# Patient Record
Sex: Male | Born: 2000 | Race: Asian | Hispanic: No | Marital: Single | Smoking: Never smoker
Health system: Western US, Academic
[De-identification: ages and names within clinical notes are randomized; demographics above are authoritative.]

---
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
## 2021-02-16 ENCOUNTER — Other Ambulatory Visit: Payer: Self-pay | Admitting: Family Medicine

## 2021-02-16 LAB — COVID-19 CORONAVIRUS DETECTION ASSAY ASYMPTOMATIC: COVID-19 Coronavirus by PCR – External Lab: NOT DETECTED

## 2022-01-02 ENCOUNTER — Ambulatory Visit (INDEPENDENT_AMBULATORY_CARE_PROVIDER_SITE_OTHER): Admitting: Public Health & General Preventive Medicine

## 2022-01-02 ENCOUNTER — Encounter (INDEPENDENT_AMBULATORY_CARE_PROVIDER_SITE_OTHER): Payer: Self-pay | Admitting: Public Health & General Preventive Medicine

## 2022-01-02 VITALS — BP 122/67 | HR 85 | Temp 96.9°F | Ht 66.69 in | Wt 133.0 lb

## 2022-01-02 NOTE — Progress Notes (Addendum)
Chief Complaint   Patient presents with    Other     Check up and labs and flu shot       Race, Ethnicity, Gender Identity and Sexuality (Go To REAL ME/SOGI section   above to add or update information)    Preferred Name: Richard Aguirre Race: Asian   Hispanic/Latin Ethnicity: Not Hispanic, Latino(a), or Spanish origin   Ethnicity: [place]   Patient's gender identity: Male    Patient's sexual orientation: Straight    Patient's sex assigned at birth: Male         Pt was instructed to update their problem list, allergies, medical history, medications, social history and family history prior to visit or at their kiosk check-in, prior to every visit.    Pt's chart was reviewed prior to the visit in my usual manner.     Subjective:   Richard Aguirre is a(n) 21 year old person who presents to [facility] for well check  Doing well  2 weeks ago few red bumps on arms and legs, has used a hot tub.  Resolved  Skin can be dry on his arms. Ok today.    Junior in [place]  See last visit  Forgot to do screening labs  Wants flu vaccine    Little exercise, too busy           Denies fever, chills, night sweats, sore throat, headache, visual problems, cough, chest pain, pleuritic chest pain, shortness of breath, dyspnea on exertion, wheeze, palpitations, syncope, abdominal pain, nausea, emesis, diarrhea, brbpr, melena, pelvic pain, back pain, GU symptoms, photophobia, meningismus, leg swelling or pain, rash.    No known history of blood clots, pulmonary embolus, pneumothorax, myocarditis    Denies mental health concerns today.      Review of Systems:    Patient denies other symptoms or concerns today.      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     No current facility-administered  medications for this visit.     Allergies   Allergen Reactions    Mushroom Extract Complex Rash     And Throat Swelling   Occurs specifically with Shitake       Immunization History   Administered Date(s) Administered    (Chicken Pox) Varicella Vaccine 05/26/2002, 05/22/2005    COVID-19 (Moderna) Low Dose Red Cap >= 18 Years 02/05/2020    COVID-19 (Moderna) Red Cap >= 12 Years 06/12/2019, 07/10/2019    DTaP 05/26/2002, 06/14/2006    DTaP-HepB-IPV ([place]) 07/15/2001, 09/24/2001, 11/18/2001    H1N1 Vaccine 05/11/2008    HIB (PedvaxHIB) Normally 3 Dose Sch 07/15/2001, 09/24/2001    HPV Unspecified Formulation 07/15/2013, 09/15/2013, 01/14/2014    HPV-4 Vaccine (GARDASIL-4) 07/15/2013, 09/15/2013    HPV-9 Vaccine (GARDASIL-9) 01/14/2014    Hep-A Ped/Adol 2 Dose Schedule 05/18/2003, 05/16/2004    Hepatitis B Vaccine Adjuvanted (Adult 18 Yrs and Older) 09/24/2001, 11/18/2001, 05/26/2002    Hepatitis B vaccine (adult 20 yrs and older) 2001-08-08    Hib-HepB (COMVAX) 05/26/2002    Inactivated Polio Vaccine (IPV) 07/15/2001, 09/24/2001, 11/18/2001, 05/22/2005    Influenza Vaccine (Unspecified) 12/29/2009, 11/04/2012, 11/19/2013    Influenza Vaccine >=6 Months 11/15/2011, 11/04/2012,  11/19/2013, 10/28/2014, 12/29/2015, 11/01/2016, 10/25/2017, 11/07/2018, 11/12/2019, 01/02/2022    Influenza Vaccine, Nasal Quadrivalent 02/15/2011    MMR 05/26/2002, 05/22/2005    MMR-Varicella (ProQuad) 05/22/2005    Meningococcal Vaccine 07/24/2012, 07/16/2017    Pneumococcal 7 Vaccine (PREVNAR-7) 07/15/2001, 09/24/2001, 11/18/2001, 12/01/2002    Tdap 07/10/2011    Typhoid IM vaccine (Vi) 08/13/2008     Patient Active Problem List    Diagnosis Date Noted    Well adult health check 01/02/2022    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  No family history on file.      Objective:  BP 122/67   Pulse 85   Temp 96.9 F (36.1 C) (Oral)   Ht 5' 6.69" (1.694 m)   Wt 60.3 kg (133  lb)   BMI 21.02 kg/m   No LMP for male patient.  Body mass index is 21.02 kg/m.      Wt Readings from Last 5 Encounters:   01/02/22 60.3 kg (133 lb)   05/28/20 58.4 kg (128 lb 12.8 oz) (12 %, Z= -1.15)*   11/12/19 57.8 kg (127 lb 6.4 oz) (13 %, Z= -1.13)*     * Growth percentiles are based on CDC (Boys, 2-20 Years) data.     Blood Pressure   01/02/22 122/67   05/28/20 132/84   11/12/19 127/79           Patient declines chaperone.    Constitutional:       General: Not in acute distress. Pleasant.     Appearance: Not ill-appearing, toxic-appearing or diaphoretic.   HENT:      Mouth: Mucous membranes are moist.      Pharynx: Oropharynx is clear. No oropharyngeal exudate or erythema.     No trismus. Uvula is midline.  Lymphadenopathy:      No cervical or supraclavicular adenopathy.   Eyes:      General: No scleral icterus. No photophobia.     Conjunctiva/sclera: Conjunctivae normal.      Pupils: Pupils are equal, round, and reactive to light.  Neck:      Normal range of motion and neck supple. No neck rigidity or muscular tenderness.       Normal thyroid exam.      Trachea is midline. No crepitus.  Cardiovascular:      Rate and Rhythm: Normal rate and regular rhythm.      Pulses: Normal pulses. No splinter hemorrhages.     Heart sounds: Normal heart sounds.   Pulmonary:      Effort: Pulmonary effort is normal. Excellent air movement.     Breath sounds: Normal breath sounds. No wheezing, rales or rhonchi.   Abdominal:     There is no distension, tenderness, or guarding. No hepatosplenomegaly.  Musculoskeletal: Normal range of motion. No edema.  Back: No CVAT. No spinal TTP. No deformity.  GU/Rectal: patient declined exam  Skin:     General: Skin is warm.      Capillary Refill: Capillary refill takes less than 1 second.     Coloration: Skin is not jaundiced.      Findings: No erythema or rash. No concerning lesions.  Neurological:      General: No focal deficit present.      Mental Status: alert and oriented to person,  place, and time.      Gait normal.   Psychiatric:         Normal affect. Normal  thought process and speech.      Assessment and Plan:  Isao was seen today for other.    Diagnoses and all orders for this visit:    Need for vaccination  -     Fluarix/FluZone Quadrivalent (>=6 Months) - Prefilled Syringe    Well adult health check  -     CBC - [facility]; Future  -     CHEM Panel - [facility]; Future  -     Glycosylated Hgb(A1C), Blood; Future  -     Urinalysis - [facility]; Future  -     TSH with Reflex to FT4 - [facility]; Future  -     Lipid Panel - [facility]; Future         Education  Counseling  Risks, alternatives, benefits, options and side-effects discussed with patient  Discussed insurance coverage    ** Please see Patient Instructions for plan as well    Return or call for any problems, new or worse symptoms: phone [phone]    Call 911 or go to the ER if an emergency    Go to the [facility] on [address] in [facility] or [place] or ANY local urgent care/ER for new/worsening problems or if having problems after-hours      ** please excuse typing errors

## 2022-01-02 NOTE — Interdisciplinary (Signed)
The patient has completed the online vaccine questionnaire, has been provided with the appropriate Vaccine Information Statement (VIS) and indicates no contraindications to receiving the ordered vaccine(s).  I have verified the correct vaccine(s) and dose(s) with the ordering Provider.   The patient was provided with warning signs to watch for in the event of an allergic response and possible side effects and I have advised the patient to wait 15-20 mins in the clinic following administration of the vaccine(s).  After this time, if they are feeing well, they may leave the clinic and can contact a health care provider should they need any further medical attention.

## 2022-01-02 NOTE — Patient Instructions (Addendum)
According to the records in your [facility] chart, you are overdue for the Tdap (tetanus) booster vaccine given every 10 years  Please make a Nurse Clinic Appt on MyChart OR update in MyChart if you already had the vaccine in the last 10 years    Fasting labs    Exercise most days of the week      Allergy trigger avoidance:    Take a brief cool shower only once a day  Dove soap bar  Keep your windows closed during the day  No vacuuming  Once a day anti-histamine tablet for sneezing, itchy eyes or itchy skin: Cetirizine (Zyrtec): you don't need a prescription for this and can get at [facility] or [facility]  Moisturize your skin twice a day with Cerave, Eucerin, Aveeno or Lubriderm lotion            Return or call for problems    Go the Emergency Room for worsening problems        Please get a Flu (influenza) vaccine every fall quarter.  You can schedule in MyChart under Nurse Clinic or get at any [facility] (show your insurance card)    You can get the COVID vaccine at any [facility] or make a Nurse Clinic Appt in MyChart    ** Secure messages in MyChart should NOT be used for urgent medical questions or advice. You may not get a response for a few business days.    For Mental Health concerns or appointments, please call [facility] at [phone]    Need an appointment at [facility]?  Book on MyChart or please call Group 1 at [phone] if you need help    Medical questions or concerns? Call [phone] to speak to a Nurse including after-hours when the building is closed.    Insurance questions?  Call [phone]      AFTER HOURS CARE    Go to the [facility] on [facility], the [place] in [facility] or [place] or ANY local urgent care/Emergency Room for new or worsening problems or if having problems when we are closed. You do not need a referral from [facility].    [facility]  Open 7 days a week, 8 am to 8 pm  [address]  [place], [place] [address]  [phone]     If urgent care is closed,  please go to the [place] [facility] or the closest Emergency Room.        PICK A PRIMARY CARE PROVIDER if you don't have one or want to change (pick an NP or a Physician)    Pick a PCP and follow-up as needed  The [facility] website has a list of providers to choose from  [email or web address]      PREVENTION:    Skin cancer prevention: sunscreen (titanium oxide or zinc oxide best), wear a hat, avoid the midday sun    See an eye doctor at [facility] clinic every 1-2 years [phone])    See a dentist twice a year. If you have SHIP:   [email or web address]

## 2022-01-05 ENCOUNTER — Other Ambulatory Visit (INDEPENDENT_AMBULATORY_CARE_PROVIDER_SITE_OTHER)

## 2022-01-05 ENCOUNTER — Encounter (INDEPENDENT_AMBULATORY_CARE_PROVIDER_SITE_OTHER): Payer: Self-pay | Admitting: Public Health & General Preventive Medicine

## 2022-01-05 LAB — CBC FOR PE (WITH/WITHOUT DIFF)
Baso#: 0.03 10*3/uL (ref 0.00–0.20)
Baso%: 0.3 % (ref 0.0–2.0)
Eos#: 0.17 10*3/uL (ref 0.00–0.40)
Eos%: 1.5 % (ref 0.0–5.0)
Hct: 43.3 % (ref 40.0–52.0)
Hgb: 14.5 g/dL (ref 13.6–17.2)
IMMUNOGRANULOCYTES#s: 0.01 10*3/uL (ref 0.00–0.10)
IMMUNOGRANULOCYTES%s: 0.1 % (ref 0.0–1.0)
Lym#: 2 10*3/uL (ref 1.00–5.00)
Lym%: 17.3 % — ABNORMAL LOW (ref 25.0–50.0)
MCH: 28 pg (ref 27.0–33.0)
MCHC: 33.5 g/dL (ref 32.0–36.0)
MCV: 84 um3 (ref 80–97)
MPV: 9 fL (ref 7.0–11.0)
Mon#: 0.98 10*3/uL (ref 0.00–1.00)
Mon%: 8.5 % (ref 2.0–10.0)
Neu#: 8.4 10*3/uL — ABNORMAL HIGH (ref 2.00–8.00)
Neu%: 72.3 % (ref 50.0–80.0)
PLT: 245 10*3/uL (ref 150–450)
RBC: 5.17 M/uL (ref 4.30–5.90)
RDW-CV: 12.1 % (ref 11.0–15.0)
RDW-SD: 37.7 % (ref 35.1–43.9)
WBC: 11.6 10*3/uL — ABNORMAL HIGH (ref 3.9–9.9)

## 2022-01-05 LAB — CHEM PANEL - SHS
ALT: 15 U/L (ref 5–30)
AST: 15 U/L (ref 7–31)
Albumin: 5.2 g/dL (ref 3.5–5.2)
Alkaline Phosphatase: 59 U/L (ref 44–147)
Bilirubin, Total: 0.9 mg/dL (ref 0.3–1.2)
Calcium: 9.8 mg/dL (ref 8.5–10.2)
Carbon Dioxide: 27 mmol/L (ref 23–29)
Chloride: 98 mmol/L (ref 98–107)
Creatinine: 1.14 mg/dL (ref 0.90–1.30)
GLOBULIN: 3 mg/dL
Glucose: 84 mg/dL (ref 70–105)
Potassium: 3.9 mmol/L (ref 3.5–5.1)
Protein, Total: 7.8 g/dL (ref 6.4–8.3)
Sodium: 138 mmol/L (ref 136–145)
Urea Nitrogen (BUN): 18 mg/dL (ref 6–20)

## 2022-01-05 LAB — URINALYSIS - SHS
Urine Bilirubin: NEGATIVE mg/dl
Urine Blood: NEGATIVE /uL
Urine Ketones: 50 mg/dl — AB
Urine Leukocytes: NEGATIVE /uL
Urine Nitrite: NEGATIVE
Urine Protein: 15 mg/dL — AB
Urine Specific Gravity: 1.02 (ref 1.005–1.035)
Urine pH: 5 (ref 5–9)

## 2022-01-05 LAB — TSH WITH REFLEX TO FT4 - SHS: TSH: 1.11 ï¿½IU/mL (ref 0.50–5.80)

## 2022-01-05 LAB — LIPID PANEL - SHS
CHOL/HDL RATIO: 3 mg/dL (ref <5)
Cholesterol, Total: 209 mg/dL — ABNORMAL HIGH (ref <200)
HDL Cholesterol: 63 mg/dL
LDL Cholesterol: 134 mg/dL — ABNORMAL HIGH (ref <100)
NON-HDLC RATIO: 146 mg/dL — ABNORMAL HIGH (ref <130)
Triglycerides: 58 mg/dL (ref <150)

## 2022-01-05 LAB — URINE MICROSCOPIC - SHS

## 2022-01-06 LAB — GLYCOSYLATED HGB(A1C), BLOOD: Hgb A1C: 5.3 % of total Hgb (ref <5.7)

## 2022-12-06 ENCOUNTER — Emergency Department (INDEPENDENT_AMBULATORY_CARE_PROVIDER_SITE_OTHER): Payer: No Typology Code available for payment source | Admitting: Emergency Medicine

## 2022-12-06 VITALS — BP 126/84 | HR 122 | Temp 100.0°F | Resp 16

## 2022-12-06 DIAGNOSIS — R509 Fever, unspecified: Secondary | ICD-10-CM

## 2022-12-06 DIAGNOSIS — J029 Acute pharyngitis, unspecified: Secondary | ICD-10-CM

## 2022-12-06 LAB — RAPID INFLUENZA A&B NAAT (POCT)
Influenza A, Rapid: NOT DETECTED
Influenza B, Rapid: NOT DETECTED

## 2022-12-06 LAB — COVID-19 RAPID NAAT (POCT): COVID-19 Rapid Assay (POCT): NOT DETECTED

## 2022-12-06 NOTE — Patient Instructions (Signed)
Take advil and drink fluids for your symptoms.  Report to the ED for worsening symptoms.

## 2022-12-06 NOTE — Progress Notes (Signed)
Urgent Care Services    The Date of Service for the Urgent Care encounter is No admission date for patient encounter.   Chief Complaint     Chief Complaint   Patient presents with    Cough    Fever- 9 Weeks To 74 Years     Since Monday.   Took Tylenol at 0400, Dayquil 1 pm    Sore Throat - Simple       Case summary is as follows: The Richard Aguirre is a 22 year old male presents with cough, fever, and sore throat.  Pt reports symptoms started since Monday.  Pt took tylenol and dayquil.  Pt denies trouble swallowing.  No chest pain, or shortness of breath.  He denies cough with sputum production.      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  No past surgical history on file.  Social History     Socioeconomic History    Marital status: Single     Spouse name: Not on file    Number of children: Not on file    Years of education: Not on file    Highest education level: Not on file   Occupational History    Not on file   Tobacco Use    Smoking status: Never    Smokeless tobacco: Never   Substance and Sexual Activity    Alcohol use: Never    Drug use: Never    Sexual activity: Never   Other Topics Concern    Not on file   Social History Narrative    Not on file     Social Determinants of Health     Financial Resource Strain: Not on file   Food Insecurity: Not on file   Transportation Needs: Not on file   Physical Activity: Not on file   Stress: Not on file   Social Connections: Not on file   Intimate Partner Violence: Not on file   Housing Stability: Not on file     No family history on file.    Review of Systems   Constitutional: negative, fatigue, malaise, positive fever.  Eyes: negative, eye pain.  Ears, Nose, Mouth, Throat: positive sore throat, negative nasal congestion.  CV: negative, chest pain.  Resp: positive cough, negative shortness of breath.  GI: negative, vomiting, nausea,  abdominal pain.  GU: negative, dysuria, frequency, hematuria.  Musculoskeletal: negative, back pain.  Integumentary: negative, rash.  Neuro: negative, numbness or tingling.    Physical Examination      12/06/22  1823   BP: 126/84   Pulse: (!) 122   Temp: 100 F (37.8 C)   Resp: 16   SpO2: 98%     Vital Signs noted from Triage Page.  Richard Aguirre is a well developed well nourished male in no apparent distress. he is alert and oriented *3.   HEENT exam shows: Ears TM clear bilaterally. Eyes PERRL EOMI. Mouth MMM. Pharynx injected.   Neck exam is non-tender. No lymphadenopathy. No Thyroid.   Chest exam is clear to auscultation bilaterally.   Cardiac exam shows regular rate and rhythm. No murmurs auscultated.   Abdominal exam has bowel sounds present and soft. The abdomen is non-tender. No masses or rebound can be palpated.  Back exam has no costovertebral angle tenderness. No spinal tenderness to palpation.   Extremity exam is significant  for 2/2 pulses.  Skin exam shows no lesions or rashes.    Medical Decision Plan:   3 days fever. Joint pain.  Chronic headaches.  Chills.  Pt tool 6 tabs of tylenol.  Last 3 days fever doesn't break.  No cigarette use.  No iv drug use.  No chest pain.  Body ache.   History of asthma.   On exam, pt well appearing.  Pharynx injected without tonsillar exudate or enlargement.  No strider. Lungs CTAB. CV shows RRR without murmurs or gallops.    Rapid covid and rapid influenza and rapid strep obtained and it was negative.     Differentials:   Viral infection.       Lab Results     Results for orders placed or performed in visit on 12/06/22   Covid-19 Rapid NAAT (POCT)   Result Value Ref Range    COVID-19 Rapid Assay (POCT) Not Detected Not Detected   Rapid Influenza A&B NAAT (POCT)   Result Value Ref Range    Influenza A, Rapid Not Detected Not Detected    Influenza B, Rapid Not Detected Not Detected       Point of Care Results:      Radiology Results   No results found.    EKG   EKG  Interpretation:     Diagnosis       ICD-10-CM ICD-9-CM    1. Sore throat  J02.9 462 Strep Test (POCT)      2. Fever, unspecified fever cause  R50.9 780.60 CANCELED: Upper Respiratory Pathogen Nucleic Acid Detection Test Viral Transport Media          Medications Prescribed (FOR Discharged Urgent Care Patients

## 2022-12-08 ENCOUNTER — Telehealth (INDEPENDENT_AMBULATORY_CARE_PROVIDER_SITE_OTHER): Payer: Self-pay | Admitting: Emergency Medicine

## 2022-12-08 NOTE — Telephone Encounter (Signed)
Pt aware of negative results and verbalized understanding.

## 2022-12-08 NOTE — Telephone Encounter (Signed)
GENERAL INQUIRY:    Caller Information:  Who is calling? Patient  Best way to contact: Phone: [phone]  What is the reason for the call? Patient calling to clarification on Strep Test results. Patient states he received 2 out of 3 labs and requesting a call back in regards to his strep test.   Encounter Details:  Is this a duplicate encounter?: No previous documentation found on this issue.  Action required by the office: Please contact caller  Patient Information:  Is MyChart active?: Yes.  Verification:  Has the inquiry been read verbatim to the caller and verbalizes satisfaction and confirms the above is accurate?: Yes  Caller has been advised this message will be transmitted to the office and can expect a response within the next 24-72 hours during working business days.  Encounter created by Madaline Guthrie from the Care Assist Team.  Signature generated from controlled access password on December 08, 2022 at 1:51 PM.

## 2022-12-11 ENCOUNTER — Emergency Department (HOSPITAL_COMMUNITY): Payer: No Typology Code available for payment source

## 2022-12-11 ENCOUNTER — Emergency Department
Admission: EM | Admit: 2022-12-11 | Discharge: 2022-12-11 | Disposition: A | Discharge status: Home / Self Care | Payer: No Typology Code available for payment source | Attending: Student in an Organized Health Care Education/Training Program | Admitting: Student in an Organized Health Care Education/Training Program

## 2022-12-11 DIAGNOSIS — R509 Fever, unspecified: Secondary | ICD-10-CM

## 2022-12-11 DIAGNOSIS — R918 Other nonspecific abnormal finding of lung field: Secondary | ICD-10-CM

## 2022-12-11 DIAGNOSIS — R059 Cough, unspecified: Secondary | ICD-10-CM

## 2022-12-11 DIAGNOSIS — J189 Pneumonia, unspecified organism: Secondary | ICD-10-CM | POA: Insufficient documentation

## 2022-12-11 LAB — UPPER RESPIRATORY PATHOGEN NUCLEIC ACID DETECTION TEST
Adenovirus PCR, Nasopharyngeal: NOT DETECTED
Bordetella Parapertussis PCR, Nasopharyngeal: NOT DETECTED
Bordetella Pertussis PCR, Nasopharyngeal: NOT DETECTED
Chlamydia pneumoniae PCR, Nasopharyngeal: NOT DETECTED
Coronavirus 229E PCR, Nasopharyngeal: NOT DETECTED
Coronavirus HKU1 PCR, Nasopharyngeal: NOT DETECTED
Coronavirus NL63 PCR, Nasopharyngeal: NOT DETECTED
Coronavirus OC43 PCR, Nasopharyngeal: NOT DETECTED
Influenza A PCR, Nasopharyngeal: NOT DETECTED
Influenza B PCR, Nasopharyngeal: NOT DETECTED
Metapneumovirus PCR, Nasopharyngeal: NOT DETECTED
Mycoplasma pneumoniae PCR, Nasopharyngeal: DETECTED — AB
Parainfluenza 1 PCR, Nasopharyngeal: NOT DETECTED
Parainfluenza 2 PCR, Nasopharyngeal: NOT DETECTED
Parainfluenza 3 PCR, Nasopharyngeal: NOT DETECTED
Parainfluenza 4 PCR, Nasopharyngeal: NOT DETECTED
Respiratory Syncytial Virus (RSV) PCR, Nasopharyngeal: NOT DETECTED
Rhinovirus/Enterovirus PCR, Nasopharyngeal: NOT DETECTED
SARS-CoV-2 (COVID-19 PCR, Nasopharyngeal: NOT DETECTED

## 2022-12-11 LAB — CBC WITH DIFF, BLOOD
ANC-Automated: 7.6 10*3/uL — ABNORMAL HIGH (ref 1.6–7.0)
Abs Basophils: 0 10*3/uL (ref <0.2)
Abs Eosinophils: 0.1 10*3/uL (ref 0.0–0.5)
Abs Lymphs: 1.1 10*3/uL (ref 0.8–3.1)
Abs Monos: 1 10*3/uL — ABNORMAL HIGH (ref 0.2–0.8)
Basophils: 0.3 %
Eosinophils: 1 %
Hct: 39.9 % — ABNORMAL LOW (ref 40.0–50.0)
Hgb: 13.4 g/dL — ABNORMAL LOW (ref 13.7–17.5)
Imm Gran %: 0.4 % (ref <1)
Imm Gran Abs: 0 10*3/uL (ref <0.1)
Lymphocytes: 10.9 %
MCH: 27.3 pg (ref 26.0–32.0)
MCHC: 33.6 g/dL (ref 32.0–36.0)
MCV: 81.4 um3 (ref 79.0–95.0)
MPV: 8.5 fL — ABNORMAL LOW (ref 9.4–12.4)
Monocytes: 10.2 %
Plt Count: 275 10*3/uL (ref 140–370)
RBC: 4.9 10*6/uL (ref 4.60–6.10)
RDW: 12.2 % (ref 12.0–14.0)
Segs: 77.2 %
WBC: 9.9 10*3/uL (ref 4.0–10.0)

## 2022-12-11 LAB — BASIC METABOLIC PANEL, BLOOD
Anion Gap: 15 mmol/L (ref 7–15)
BUN: 9 mg/dL (ref 6–20)
Bicarbonate: 23 mmol/L (ref 22–29)
Calcium: 8.6 mg/dL (ref 8.5–10.6)
Chloride: 92 mmol/L — ABNORMAL LOW (ref 98–107)
Creatinine: 0.9 mg/dL (ref 0.67–1.17)
Glucose: 93 mg/dL (ref 70–99)
Potassium: 3.5 mmol/L (ref 3.5–5.1)
Sodium: 130 mmol/L — ABNORMAL LOW (ref 136–145)
eGFR Based on CKD-EPI 2021 Equation: >60 mL/min/{1.73_m2}

## 2022-12-11 LAB — INFLUENZA A/B & SARS-COV-2 PCR COMBO FOR RAPID RESPONSE LAB
Influenza A PCR, RRL: NOT DETECTED
Influenza B PCR, RRL: NOT DETECTED
SARS-CoV-2 PCR, RRL: NOT DETECTED

## 2022-12-11 MED ORDER — AMOXICILLIN-POT CLAVULANATE 875-125 MG OR TABS
1.0000 | ORAL_TABLET | Freq: Two times a day (BID) | ORAL | 0 refills | Status: AC
Start: 2022-12-11 — End: 2022-12-16

## 2022-12-11 MED ORDER — AZITHROMYCIN 250 MG OR TABS
ORAL_TABLET | ORAL | 0 refills | Status: AC
Start: 2022-12-11 — End: 2022-12-16

## 2022-12-11 MED ORDER — ACETAMINOPHEN 325 MG PO TABS
975.0000 mg | ORAL_TABLET | Freq: Once | ORAL | Status: AC
Start: 2022-12-11 — End: 2022-12-11
  Administered 2022-12-11: 16:00:00 975 mg via ORAL
  Filled 2022-12-11: qty 3

## 2022-12-11 MED ORDER — STERILE WATER FOR INJECTION IJ SOLN
1000.0000 mg | Freq: Once | INTRAMUSCULAR | Status: AC
Start: 2022-12-11 — End: 2022-12-11
  Administered 2022-12-11: 18:00:00 1000 mg via INTRAVENOUS
  Filled 2022-12-11: qty 1000

## 2022-12-11 MED ORDER — IOHEXOL 350 MG/ML IV SOLN
500.0000 mL | Freq: Once | INTRAVENOUS | Status: AC
Start: 2022-12-11 — End: 2022-12-11
  Administered 2022-12-11: 19:00:00 75 mL via INTRAVENOUS
  Filled 2022-12-11: qty 500

## 2022-12-11 MED ORDER — AZITHROMYCIN 250 MG OR TABS
500.0000 mg | ORAL_TABLET | Freq: Once | ORAL | Status: AC
Start: 2022-12-11 — End: 2022-12-11
  Administered 2022-12-11: 18:00:00 500 mg via ORAL
  Filled 2022-12-11: qty 2

## 2022-12-11 NOTE — Discharge Instructions (Addendum)
 You were evaluated in the  Emergency Department today for fevers, your imaging demonstrates evidence of pneumonia.   You have been prescribed antibiotics for treatment of this infection, please take the entire course of the therapy as indicated, there is an additional respiratory panel test that is pending at the time of discharge, this results will not be available until tomorrow, you will only be contacted if this result is abnormal.  Please schedule follow-up for re-evaluation with your primary care doctor or student [facility] within the next 2 days.  Return to the Emergency Department if you experience worsening shortness of breath, chest pain, nausea/vomiting, or any other concerning symptoms

## 2022-12-11 NOTE — ED Notes (Signed)
Bed: Q1  Expected date:   Expected time:   Means of arrival: Automobile  Comments:

## 2022-12-11 NOTE — ED Provider Notes (Signed)
 ED Provider Note  [facility] electronic medical record reviewed for pertinent medical history.     Richard Aguirre DOB: [birth year] PMD: Duayne Venus SAILOR     Chief Complaint   Patient presents with   . Fever- 9 Weeks To 74 Years     + fever x 8 days. + dry cough today. Seen at [facility] on 11/6 , neg covid and flu       HPI: Richard Aguirre is a 22 year old male with no significant past medical history, not currently on any prescription medications, presents for evaluation of fevers. Patient initially developed symptoms 1 week ago, with fever, cough that seemed to be productive with yellow sputum which seems to be improving, body aches, and myalgias. Patient denies any sore throat, no ear pain, no headache, no neck stiffness, no abdominal pain, nausea, vomiting or diarrhea. No urinary symptoms including dysuria hematuria, and denies any diarrhea. No other systemic complaints reported. Patient recently travel to [place] and girlfriend was sick with similar symptoms, she has been getting better, and unfortunately he continues to have fevers, which prompted this evaluation.          External Data Sources (Select all that apply):      Pertinent Medical History:    PMHx: As above    No past surgical history on file.    No family history on file.    No current outpatient medications    Physical Exam  BP 111/69   Pulse 86   Temp 99 F (37.2 C)   Resp 16   Wt 59.2 kg (130 lb 8 oz)   SpO2 97%   Physical Exam  Constitutional: Well appearing, nontoxic in appearance.   Respiratory: Lung sounds CTAB. No respiratory distress.  Good respiratory effort   No tonsillar edema or erythema or exudate, no palpable lymphadenopathy, mild retropharyngeal erythema.  Cardiovascular: Regular rate and rhythm, S1S2.  Vital signs stable   Abdomen: Soft, non-distended, non-tender. No CVA tenderness  Skin: Warm and dry. No rashes  Extremities:  Moves all extremities x4, ambulates with steady gait. No lower extremity edema  Neurologic: Awake and alert,  oriented x4.  Speech is clear and fluent, follows commands without difficulty.      Orders/Medications    Orders Placed This Encounter   Procedures   . X-Ray Chest Frontal And Lateral   . CT Chest With Contrast   . CBC w/ Diff Lavender   . Basic Metabolic Panel, Blood Green Plasma Separator Tube       Medications   acetaminophen  (TYLENOL ) tablet 975 mg (975 mg Oral Given 12/11/22 1607)   cefTRIAXone  (ROCEPHIN ) 1,000 mg in sterile water  (PF) 10 mL IV (1,000 mg IntraVENOUS New Bag 12/11/22 1805)   azithromycin  (ZITHROMAX ) tablet 500 mg (500 mg Oral Given 12/11/22 1804)           Medical Decision Making/Assessment/Plan    This is a(n) 22 year old male with no significant past medical history, not on any prescription medications, presents for evaluation of persistent fevers, initially developed symptoms 6 days ago with viral type symptoms including cough, myalgias and fatigue. Patient is nontoxic in appearance, afebrile on arrival which improved after receiving Tylenol , mildly tachycardic which also resolved after the fever improved. Patient without any acute cardiopulmonary complaints, abdomen is soft and nontender, no rashes, no other systemic complaints reported.  Differential diagnosis is broad at this time, consider viral etiology of symptoms will test for COVID, influenza and will add respiratory  panel given this is his 2nd visit for similar presentation, consider possible pneumonia will obtain x-ray to evaluate, no clinical signs to suggest PTA, abdomen is soft and nontender, no clinical signs to suggest acute intra-abdominal process, or other etiology of fevers.    Initial testing negative for COVID and influenza, chest x-ray demonstrates ground-glass opacities in the right middle lobe with possible cystic lesion, added labs and CTA of his chest to evaluate. Will initiate treatment for likely community-acquired pneumonia with azithromycin  and ceftriaxone . Patient aware and agreeable with plan of care.    Labs  Reviewed   CBC WITH DIFF, BLOOD - Abnormal; Notable for the following components:       Result Value    Hgb 13.4 (*)     Hct 39.9 (*)     MPV 8.5 (*)     ANC-Automated 7.6 (*)     Abs Monos 1.0 (*)     All other components within normal limits   BASIC METABOLIC PANEL, BLOOD   INFLUENZA A/B & SARS-COV-2 PCR COMBO FOR RAPID RESPONSE LAB    Narrative:     Collect using a flocked swab in the designated M4RT viral  transport medium. Other swab types or VTM tubes will be  rejected.  Is the patient a [employer] employee?->Yes, CAMPUS   UPPER RESPIRATORY PATHOGEN NUCLEIC ACID DETECTION TEST   BLOOD CULTURE   BLOOD CULTURE     X-Ray Chest Frontal And Lateral   Final Result   IMPRESSION:   Ground-glass opacities in the right middle lobe with an associated cystic lesion measuring 1.0 cm. These findings may be seen the setting of pneumonia, however the cystic lesion is indeterminate. May obtain cross-sectional imaging as clinically indicated.         CT Chest With Contrast    (Results Pending)           ED Course/Updates/Disposition  ED Course as of 12/11/22 1941   Richard Aguirre   Mon Dec 11, 2022   1937 CT Chest With Contrast  IMPRESSION:  THIS PRELIMINARY INTERPRETATION HAS NOT BEEN REVIEWED BY AN ATTENDING RADIOLOGIST:     Consolidative opacities predominantly in the right middle lobe and lingula with associated mucous plugging, and bilateral multifocal peribronchovascular ground-glass opacities throughout, most consistent with multifocal infection.     8061 Will treat for multifocal pneumonia as outpatient with Augmentin  plus Azithromycin . Imaging results discussed with patient, fever and tachycardia have resolved, seems stable for discharge and outpatient follow up. Recommend prompt outpatient follow-up for re-evaluation, strict ED return precautions provided, the patient understands he should return to the emergency department if any worsening symptoms or new concerning symptoms develop.         ED Clinical  Impression as of 12/11/22 1941   Fever and chills   Multifocal pneumonia                       Data Reviewed:      Limited Bedside Ultrasound Procedure:   All images archived on [facility] servers.        Risk of Complications and/or Morbidity:      NOTE: Labs/meds/vitals/order above auto-refreshed with most recent information at time of signing this note. Unless otherwise noted, all MDM and evaluation by this writer ended with results available at [facility] and significant changes in management plans and /or hospital course may have occurred. See available notes either by this clinical research associate or other providers after Dispo for further information.  The note represents a split/shared encounter where my attending physician performed the substantive portion of the visit by completing the Medical Decision Making in its entirety.        Richard Meade BRAVO, NP  12/11/22 1818       Hillis Kern, MD  12/12/22 1112

## 2022-12-11 NOTE — ED Notes (Signed)
 Patient cleared and stable for D/C per MD   Patient received, reviewed and verbalized understanding of AVS  PIV D/C'd with catheter tip visually intact, bleeding controlled, dressing applied, site CDI  Rx given to patient to fill with instructions  Patient to return to ED for new/worsening symptoms or concerns  Patient pain tolerable at this time  Patient alert and oriented x 4, ambulatory out of the ED to Waiting Room with steady gait  All belongings from room with patient upon discharge

## 2022-12-12 ENCOUNTER — Encounter (INDEPENDENT_AMBULATORY_CARE_PROVIDER_SITE_OTHER): Payer: Self-pay | Admitting: Adolescent Medicine

## 2022-12-12 ENCOUNTER — Telehealth (INDEPENDENT_AMBULATORY_CARE_PROVIDER_SITE_OTHER): Payer: Self-pay

## 2022-12-12 NOTE — ED Follow-up Note (Signed)
 ED Pharmacist Culture Follow Up Note  Result Type: Respiratory pathogens nucleic acid detection test     Based on the new results below, the patient's current treatment plan: does not require a change in therapy    Response: Recommended continuation of therapy    No clinical reason to contact patient regarding result    Patient discharged with RX for azithromycin  and augmentin  to treat for CAP. No prescription change required based on results below.    Maggie Kianah Harries, PharmD  Staff Pharmacist II  [facility]      The following culture results have been reviewed:    Microbiology Results (last 7 days)       Procedure Component Value - Date/Time    Upper Respiratory Pathogen Nucleic Acid Detection Test Viral Transport Media [[id number]]  (Abnormal) Collected: 12/11/22 1557    Lab Status: Final result Specimen: Nasopharyngeal Updated: 12/11/22 2045     Adenovirus PCR, Nasopharyngeal Not Detected     Coronavirus 229E PCR, Nasopharyngeal Not Detected     Coronavirus HKU1 PCR, Nasopharyngeal Not Detected     Coronavirus NL63 PCR, Nasopharyngeal Not Detected     Coronavirus OC43 PCR, Nasopharyngeal Not Detected     SARS-CoV-2 (COVID-19 PCR, Nasopharyngeal Not Detected     Comment: This result has been reported to the [facility] and Epidemiology Unit as required  by the [place]  Code of Regulations, Title 17, Section 2505.          Metapneumovirus PCR, Nasopharyngeal Not Detected     Rhinovirus/Enterovirus PCR, Nasopharyngeal Not Detected     Influenza A PCR, Nasopharyngeal Not Detected     Comment: This result has been reported to the [facility] and Epidemiology Unit as required  by the [place]  Code of Regulations, Title 17, Section 2505.          Influenza B PCR, Nasopharyngeal Not Detected     Comment: This result has been reported to the [facility] and Epidemiology Unit as required  by the [place]  Code of  Regulations, Title 17, Section 2505.          Parainfluenza 1 PCR, Nasopharyngeal Not Detected     Parainfluenza 2 PCR, Nasopharyngeal Not Detected     Parainfluenza 3 PCR, Nasopharyngeal Not Detected     Parainfluenza 4 PCR, Nasopharyngeal Not Detected     Respiratory Syncytial Virus (RSV) PCR, Nasopharyngeal Not Detected     Comment: This result has been reported to the [facility] and Epidemiology Unit as required  by the [place]  Code of Regulations, Title 17, Section 2505.          Bordetella Parapertussis PCR, Nasopharyngeal Not Detected     Bordetella Pertussis PCR, Nasopharyngeal Not Detected     Chlamydia pneumoniae PCR, Nasopharyngeal Not Detected     Mycoplasma pneumoniae PCR, Nasopharyngeal DETECTED     Comment: This test uses an FDA-approved multiplex   real-time PCR assay for the simultaneous   qualitative detection of nucleic acids from   bacterial and viral pathogens associated with   upper respiratory tract infections. The results  of this test should not be used as the sole   basis for making clinical treatment decisions,   and should be used in conjunction with other   clinical findings. Positive results do not rule   out co-infection with other pathogens not   included in the test.  Negative results do not   preclude infection with any pathogen. This test   is not intended to be used as a test of cure.

## 2022-12-12 NOTE — Telephone Encounter (Addendum)
[facility] RN Urgent Care/ER Telephone Follow-up:called and LVM x1     Reason for visit: Pneumonia    Did the student call the [facility] Nurse Advice/Triage Line prior to presenting to the ER?: []     Date of visit/admission: 12/11/22    Date of discharge (ER only): 12/11/22    [place]/ER Recommended treatment plan: Initial testing negative for COVID and influenza, chest x-ray demonstrates ground-glass opacities in the right middle lobe with possible cystic lesion, added labs and CTA of his chest to evaluate. Will initiate treatment for likely community-acquired pneumonia with azithromycin and ceftriaxone. Patient aware and agreeable with plan of care.     Patient response to care/status since last seen: TBD    Patient Education: [facility] f/u    Instructions for follow-up:     []  An additional follow-up call (please place back on call-back list)  []  Follow-up at [facility] (PCP Dr Kyla Balzarine)  []  Specialty appointment (please specify)  []  Referral to BH/CAPS  []  Other campus resource (please specify)  []  No further action required       Noralee Stain, RN - [facility]

## 2022-12-14 ENCOUNTER — Encounter (INDEPENDENT_AMBULATORY_CARE_PROVIDER_SITE_OTHER): Payer: Self-pay | Admitting: Hospital

## 2022-12-15 ENCOUNTER — Ambulatory Visit (INDEPENDENT_AMBULATORY_CARE_PROVIDER_SITE_OTHER): Admitting: Nurse Practitioner

## 2022-12-15 ENCOUNTER — Other Ambulatory Visit (INDEPENDENT_AMBULATORY_CARE_PROVIDER_SITE_OTHER): Payer: Self-pay

## 2022-12-15 ENCOUNTER — Encounter (INDEPENDENT_AMBULATORY_CARE_PROVIDER_SITE_OTHER): Payer: Self-pay | Admitting: Nurse Practitioner

## 2022-12-15 VITALS — BP 122/82 | HR 94 | Temp 97.4°F | Ht 66.69 in

## 2022-12-15 NOTE — Progress Notes (Signed)
[place] PRIMARY CARE APPOINTMENT    Subjective:   Arnol is a 22 year old born male who is here for ER F/U (ER F/up for [place]////////////)    See in ER 11/11 diagnosed with PNA, given azithro and augmentin  Had cough and fever when presented to ER.   Feeling better today, functioning at 80% with 100% being normal  No longer having fever. Still has productive cough.   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  Pt history reviewed and updated     Objective:  Vital signs: BP 122/82 (BP Location: Left arm, BP Patient Position: Sitting, BP cuff size: Regular)   Pulse 94   Temp 97.4 F (36.3 C) (Oral)   Ht 5' 6.69" (1.694 m)   SpO2 97%   BMI 20.63 kg/m   Physical Exam  Vitals reviewed.   Constitutional:       General: He is not in acute distress.     Appearance: Normal appearance. He is not ill-appearing.   Cardiovascular:      Rate and Rhythm: Normal rate and regular rhythm.      Heart sounds: Normal heart sounds.   Pulmonary:      Effort: Pulmonary effort is normal. No respiratory distress.      Breath sounds: Wheezing (expiratory wheezes L>R) and rhonchi present.   Neurological:      Mental Status: He is alert.     PHQ-9 score: 0  Thoughts of self harm? NA    X-Ray Chest Frontal And Lateral   Final Result   IMPRESSION:   Ground-glass opacities in the right middle lobe with an  associated cystic lesion measuring 1.0 cm. These findings may be seen the setting of pneumonia, however the cystic lesion is indeterminate. May obtain cross-sectional imaging as clinically indicated.       CT Chest With Contrast  IMPRESSION:  THIS PRELIMINARY INTERPRETATION HAS NOT BEEN REVIEWED BY AN ATTENDING RADIOLOGIST:     Consolidative opacities predominantly in the right middle lobe and lingula with associated mucous plugging, and bilateral multifocal peribronchovascular ground-glass opacities throughout, most consistent with multifocal infection.     Assessment/Plan:  Kylian was seen today for er f/u.  Diagnoses and all orders for this visit:    Multifocal pneumonia - improving on antibiotics  -     X-Ray Chest Frontal And Lateral; Future    Advised to complete antibiotics  Take deep breaths periodically throughout the day, attempt to expectorate phlegm when in hot shower. Get adequate rest and hydration  Repeat CXR on 11/25 to check for resolution of abnormal findings on prior CXR  Advised if worsening symptoms to return to clinic or if symptoms life threatening to go to ER.  All  questions answered. Reviewed plan and patient verbalized understanding  The after visit summary is available for the patient to view electronically through their MyStudentChart.  Maria-Christina Delora Fuel, NP    Patient Questionnaires reviewed   [place] Phq[facility] [employer] Phq9 Depression Questionnaire       Question 12/15/2022 10:20 AM PST - Filed by Patient    Over the last two weeks, how often have you been bothered by any of the following problems?       Little interest or pleasure in doing things Not at all    Feeling down, depressed, or hopeless Not at all    [facility] Summary Score (range: 0 - 3) 0          [facility] Patient Entered Hpi Selection Qnr       Question 12/15/2022 10:24 AM PST - Ceasar Mons by Patient    Sexuality and Gender Identity     I prefer to opt out: / Prefiero no contestar:  Yes    What is your preferred name? /  Cul es su nombre preferido?  Dayson    What is your gender identity? Male    What is your sexual orientation? Straight    What is the primary reason for your visit? Other    Please describe your symptoms. Follow up for er visit currently no symptoms    Have you had these symptoms before? No    How long have you been having these symptoms?       Please list any medications you are currently taking for this condition.       Please describe any probable cause for these symptoms.

## 2022-12-16 LAB — BLOOD CULTURE
Blood Culture Result: NO GROWTH
Blood Culture Result: NO GROWTH

## 2022-12-25 ENCOUNTER — Other Ambulatory Visit (INDEPENDENT_AMBULATORY_CARE_PROVIDER_SITE_OTHER)

## 2022-12-25 ENCOUNTER — Encounter (INDEPENDENT_AMBULATORY_CARE_PROVIDER_SITE_OTHER)

## 2023-09-07 ENCOUNTER — Encounter: Payer: Self-pay | Admitting: Hospital

## 2023-10-21 ENCOUNTER — Encounter (HOSPITAL_COMMUNITY): Payer: Self-pay

## 2023-10-21 ENCOUNTER — Emergency Department (HOSPITAL_COMMUNITY): Payer: Self-pay

## 2023-10-21 ENCOUNTER — Other Ambulatory Visit: Payer: Self-pay

## 2023-10-21 ENCOUNTER — Emergency Department (HOSPITAL_COMMUNITY)
Admission: EM | Admit: 2023-10-21 | Discharge: 2023-10-21 | Disposition: A | Discharge status: Home / Self Care | Payer: Self-pay | Attending: Emergency Medicine | Admitting: Emergency Medicine

## 2023-10-21 DIAGNOSIS — W108XXA Fall (on) (from) other stairs and steps, initial encounter: Secondary | ICD-10-CM | POA: Insufficient documentation

## 2023-10-21 DIAGNOSIS — S43005A Unspecified dislocation of left shoulder joint, initial encounter: Secondary | ICD-10-CM

## 2023-10-21 DIAGNOSIS — S43015A Anterior dislocation of left humerus, initial encounter: Secondary | ICD-10-CM | POA: Insufficient documentation

## 2023-10-21 DIAGNOSIS — S4992XA Unspecified injury of left shoulder and upper arm, initial encounter: Secondary | ICD-10-CM | POA: Diagnosis present

## 2023-10-21 DIAGNOSIS — S42292A Other displaced fracture of upper end of left humerus, initial encounter for closed fracture: Secondary | ICD-10-CM

## 2023-10-21 MED ORDER — LIDOCAINE 5 % EX PTCH: 1.0000 | MEDICATED_PATCH | CUTANEOUS | 0 refills | Status: AC

## 2023-10-21 MED ORDER — FENTANYL CITRATE PF 50 MCG/ML IJ SOSY
PREFILLED_SYRINGE | INTRAMUSCULAR | Status: AC
  Administered 2023-10-21: 50 ug
  Filled 2023-10-21: qty 1

## 2023-10-21 MED ORDER — ONDANSETRON HCL 4 MG/2ML IJ SOLN
4.0000 mg | Freq: Once | INTRAMUSCULAR | Status: AC
  Administered 2023-10-21: 4 mg via INTRAVENOUS
  Filled 2023-10-21: qty 2

## 2023-10-21 MED ORDER — SODIUM CHLORIDE 0.9 % IV BOLUS
500.0000 mL | Freq: Once | INTRAVENOUS | Status: AC
  Administered 2023-10-21: 500 mL via INTRAVENOUS

## 2023-10-21 MED ORDER — OXYCODONE-ACETAMINOPHEN 5-325 MG PO TABS
1.0000 | ORAL_TABLET | ORAL | Status: DC | PRN
  Administered 2023-10-21: 1 via ORAL
  Filled 2023-10-21: qty 1

## 2023-10-21 MED ORDER — METHOCARBAMOL 1000 MG/10ML IJ SOLN
1000.0000 mg | Freq: Once | INTRAMUSCULAR | Status: AC
  Administered 2023-10-21: 1000 mg via INTRAVENOUS
  Filled 2023-10-21: qty 10

## 2023-10-21 MED ORDER — DICLOFENAC SODIUM ER 100 MG PO TB24: 100.0000 mg | ORAL_TABLET | Freq: Every day | ORAL | 0 refills | Status: AC

## 2023-10-21 MED ORDER — KETOROLAC TROMETHAMINE 30 MG/ML IJ SOLN
30.0000 mg | Freq: Once | INTRAMUSCULAR | Status: AC
  Administered 2023-10-21: 30 mg via INTRAVENOUS
  Filled 2023-10-21: qty 1

## 2023-10-21 NOTE — ED Provider Notes (Signed)
 [place] EMERGENCY DEPARTMENT AT [facility] Provider Note   CSN: [id number] Arrival date & time: 10/21/23  9693     Patient presents with: Shoulder Injury   Bernie Ransford is a 23 y.o. male.   The history is provided by the patient and a friend.  Shoulder Injury This is a new problem. The current episode started less than 1 hour ago. The problem occurs constantly. The problem has not changed since onset.Pertinent negatives include no chest pain, no abdominal pain, no headaches and no shortness of breath. Nothing aggravates the symptoms. Nothing relieves the symptoms. He has tried nothing for the symptoms. The treatment provided no relief.  Patient was out at a bar and had approximately 8 shots, he then fell down 5-6 stairs striking L shoulder.  Did not hit head, no LOC.       Prior to Admission medications   Medication Sig Start Date End Date Taking? Authorizing Provider  Diclofenac  Sodium CR 100 MG 24 hr tablet Take 1 tablet (100 mg total) by mouth daily. 10/21/23  Yes Aissatou Fronczak, MD  lidocaine  (LIDODERM ) 5 % Place 1 patch onto the skin daily. Remove & Discard patch within 12 hours or as directed by MD 10/21/23  Yes Alijah Hyde, MD  amoxicillin  (AMOXIL ) 250 MG/5ML suspension Take 15 mLs (750 mg total) by mouth 2 (two) times daily. 750mg  po bid x 10 days qs 02/27/13   Rhae Lye, MD  dextromethorphan (DELSYM) 30 MG/5ML liquid Take 120 mg by mouth 2 (two) times daily as needed for cough.    [provider]  guaiFENesin (ROBITUSSIN) 100 MG/5ML SOLN Take 100 mg by mouth daily as needed for cough or to loosen phlegm.    [provider]    Allergies: Patient has no known allergies.    Review of Systems  Respiratory:  Negative for shortness of breath.   Cardiovascular:  Negative for chest pain.  Gastrointestinal:  Negative for abdominal pain.  Neurological:  Negative for headaches.    Updated Vital Signs BP 122/65   Pulse (!) 103   Temp  98.5 F (36.9 C) (Oral)   Resp 16   Ht 5' 9 (1.753 m)   Wt 86.2 kg   SpO2 98%   BMI 28.06 kg/m   Physical Exam Vitals and nursing note reviewed.  Constitutional:      General: He is not in acute distress.    Appearance: He is well-developed. He is not diaphoretic.  HENT:     Head: Normocephalic and atraumatic.  Eyes:     Conjunctiva/sclera: Conjunctivae normal.     Pupils: Pupils are equal, round, and reactive to light.  Cardiovascular:     Rate and Rhythm: Normal rate and regular rhythm.  Pulmonary:     Effort: Pulmonary effort is normal.     Breath sounds: Normal breath sounds. No wheezing or rales.  Abdominal:     General: Bowel sounds are normal.     Palpations: Abdomen is soft.     Tenderness: There is no abdominal tenderness. There is no guarding or rebound.  Musculoskeletal:     Left shoulder: Deformity and tenderness present. No crepitus.     Left upper arm: Normal.     Right wrist: No bony tenderness or snuff box tenderness. Normal pulse.     Left wrist: No bony tenderness or snuff box tenderness. Normal pulse.     Right hand: Normal.     Left hand: Normal.  Cervical back: Normal range of motion and neck supple.  Skin:    General: Skin is warm and dry.  Neurological:     Mental Status: He is alert and oriented to person, place, and time.     (all labs ordered are listed, but only abnormal results are displayed) Labs Reviewed - No data to display  EKG: EKG Interpretation Date/Time:  Sunday October 21 2023 03:43:45 EDT Ventricular Rate:  136 PR Interval:  140 QRS Duration:  97 QT Interval:  293 QTC Calculation: 441 R Axis:   73  Text Interpretation: Sinus tachycardia Confirmed by Gwenivere Hiraldo ([id number]) on 10/21/2023 3:48:59 AM  Radiology: [facility]COLA Shoulder Left Result Date: 10/21/2023 EXAM: 3 VIEW XRAY OF THE LEFT SHOULDER 10/21/2023 03:34:14 AM COMPARISON: None available. CLINICAL HISTORY: Injury/deformity. Sublux left shoulder after falling down  stairs at the bar, left shoulder pain. FINDINGS: BONES AND JOINTS: Anterior glenohumeral joint dislocation. Possible Hill-Sachs lesion. SOFT TISSUES: No abnormal calcifications. Visualized lung is unremarkable. IMPRESSION: 1. Anterior shoulder dislocation. 2. Possible Hill-Sachs lesion along the posterolateral humeral head, equivocal. Electronically signed by: Pinkie Pebbles MD 10/21/2023 03:42 AM EDT RP Workstation: [id number]     .Reduction of dislocation  Date/Time: 10/21/2023 5:43 AM  Performed by: Nettie Earing, MD Authorized by: Nettie Earing, MD  Consent: Verbal consent obtained. Written consent not obtained Risks and benefits: risks, benefits and alternatives were discussed Consent given by: patient Relevant documents: relevant documents present and verified Imaging studies: imaging studies available Patient identity confirmed: arm band Preparation: Patient was prepped and draped in the usual sterile fashion. Local anesthesia used: no  Anesthesia: Local anesthesia used: no  Sedation: Patient sedated: no  Patient tolerance: patient tolerated the procedure well with no immediate complications Comments: Biceps massage and scapular manipulation with successful reduction.  Sling placed. Post reduction films obtained       Medications Ordered in the ED  oxyCODONE -acetaminophen  (PERCOCET/ROXICET) 5-325 MG per tablet 1 tablet (1 tablet Oral Given 10/21/23 0325)  ketorolac  (TORADOL ) 30 MG/ML injection 30 mg (30 mg Intravenous Given 10/21/23 0440)  methocarbamol  (ROBAXIN ) injection 1,000 mg (1,000 mg Intravenous Given 10/21/23 0440)  ondansetron  (ZOFRAN ) injection 4 mg (4 mg Intravenous Given 10/21/23 0448)  sodium chloride  0.9 % bolus 500 mL (500 mLs Intravenous New Bag/Given 10/21/23 0449)  fentaNYL  (SUBLIMAZE ) 50 MCG/ML injection (50 mcg  Given 10/21/23 0539)                                    Medical Decision Making Patient fell coming out of a bar 5-6 steps   Amount  and/or Complexity of Data Reviewed Independent Historian: friend    Details: See above  External Data Reviewed: notes.    Details: Previous notes reviewed  Radiology: ordered and independent interpretation performed.    Details: Anterior dislocation by me   Risk Prescription drug management.     Final diagnoses:  Dislocation of left shoulder joint, initial encounter  Hill Sachs deformity, left   he patient is nontoxic-appearing on exam and vital signs are within normal limits.  I have reviewed the triage vital signs and the nursing notes. Pertinent labs & imaging results that were available during my care of the patient were reviewed by me and considered in my medical decision making (see chart for details). After history, exam, and medical workup I feel the patient has been appropriately medically screened and is safe for discharge home. Pertinent diagnoses  were discussed with the patient. Patient was given return precautions.    ED Discharge Orders          Ordered    Diclofenac  Sodium CR 100 MG 24 hr tablet  Daily        10/21/23 0542    lidocaine  (LIDODERM ) 5 %  Every 24 hours        10/21/23 0542               Rodneshia Greenhouse, MD 10/21/23 9451

## 2023-10-21 NOTE — ED Triage Notes (Signed)
 Pt reports falling down 5-6 steps at the bar. Denies hitting head or losing consciousness. Pt braced self with both hands and reports left shoulder dislocated on impact. Limited mobility noted along with (+) deformity. Strong radial pulse.

## 2023-11-23 ENCOUNTER — Encounter (INDEPENDENT_AMBULATORY_CARE_PROVIDER_SITE_OTHER): Payer: Self-pay | Admitting: Hospital

## 2023-11-23 ENCOUNTER — Ambulatory Visit (INDEPENDENT_AMBULATORY_CARE_PROVIDER_SITE_OTHER): Admitting: Nurse Practitioner

## 2023-11-23 ENCOUNTER — Other Ambulatory Visit (INDEPENDENT_AMBULATORY_CARE_PROVIDER_SITE_OTHER): Payer: Self-pay

## 2023-11-23 VITALS — BP 135/72 | HR 93 | Temp 98.1°F | Ht 66.61 in | Wt 137.2 lb

## 2023-11-23 DIAGNOSIS — Z008 Encounter for other general examination: Secondary | ICD-10-CM

## 2023-11-23 NOTE — Progress Notes (Signed)
 [place] [facility] APPOINTMENT  Subjective:   Richard Aguirre is a 23 year old AMAB who is here for Follow Up (Skin issues F/up )    # Dermatitis - Rash started about 4-5 days ago on posterior knees, no known triggers  Has been using petroleum jelly and over the counter hydrocortisone, applying cool water  is the most relieving  Had eczema as a child, not recently    Current Medications[1]  Allergies: Mushroom extract complex   Problem List[2]  Past Surgical History[3]  HCM:   Pap/STD?  Immunizations due? TdaP, Flu  Pt history reviewed and updated     Objective:  Vital signs: BP 135/72 (BP Location: Left arm, BP Patient Position: Sitting, BP cuff size: Regular)   Pulse 93   Temp 98.1 F (36.7 C) (Oral)   Ht 5' 6.61 (1.692 m)   Wt 62.2 kg (137 lb 3.2 oz)   BMI 21.74 kg/m   Physical Exam  Vitals reviewed.   Constitutional:       Appearance: Normal appearance.   Skin:          Comments: Light pink, slightly raised 3cmx4cm patches on posterior knees, skin intact   Neurological:      Mental Status: Richard Aguirre is alert.     PHQ-9 score: 0  Thoughts of self harm? NA    Assessment/Plan:  Richard Aguirre was seen today for follow up.  Diagnoses and all orders for this visit:    Dermatitis   - continue with petroleum jelly  - can use over the counter hydrocortisone 1% ointment twice a day for up to 2 weeks for full resolution  - take cetirizine or loratidine daily if having itching    Need for vaccination  -     Fluarix/FluZone Trivalent (>=6 Months) - Prefilled Syringe  -     Tdap (BOOSTRIX or Adacel) for patients 77-29 years old    Reviewed benefits, risks and side effects of new medications prescribed.  Return if symptoms worsen or fail to improve.  The patient is aware that if any new symptoms arise or if their current symptoms worsen or persist, they should follow-[facility] with [facility] or go to their nearest Urgent Care for evaluation. The plan of care and next steps related to this encounter were discussed with the patient  including relevant risks, benefits, and/or alternatives.  The patient's questions were answered during this encounter, understanding verbalized, and patient agrees to the outlined treatment and plan of care.  The after visit summary is available for the patient to view electronically through their MyStudentChart.  Richard Ledell Redo, NP     Patient questionnaires reviewed   [place] Phq2 Branching Phq9 Depression Questionnaire       Question 11/23/2023 10:53 AM PDT - Fredricka by Patient    Over the last two weeks, how often have you been bothered by any of the following problems?       Little interest or pleasure in doing things Not at all    Feeling down, depressed, or hopeless Not at all    [facility] Summary Score (range: 0 - 3) 0          [facility] Patient Entered Hpi Selection Qnr       Question 11/23/2023 10:56 AM PDT - Fredricka by Patient    Sexuality and Gender Identity     I prefer to opt out: / Prefiero no contestar:  Yes    What is your preferred name? / Cul es  su nombre preferido?        What is your gender identity?       What is your sexual orientation?       What is the primary reason for your visit? Skin Problem    Skin Problem     Describe your skin problem or concern eczema like rash    Where is the problem located? Back of knee    How long have you had the problem? 5days    What treatments, if any, have you tried?       Do you have any of the following symptoms? Itching     Redness    Have you ever had similar symptoms in the past? Yes    If you have been previously diagnosed with a similar skin problem, please describe. I think I had eczema before but it was when I was a child    Any new exposure to:       If yes to any new exposure, please describe.                    [1] No current outpatient medications on file.  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
